# Patient Record
Sex: Female | Born: 2005 | Race: White | Hispanic: No | Marital: Single | Smoking: Never smoker
Health system: Southern US, Community
[De-identification: ages and names within clinical notes are randomized; demographics above are authoritative.]

## PROBLEM LIST (undated history)

## (undated) DIAGNOSIS — Z789 Other specified health status: Secondary | ICD-10-CM

## (undated) HISTORY — PX: OTHER SURGICAL HISTORY: SHX169

## (undated) HISTORY — DX: Other specified health status: Z78.9

---
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
## 2023-03-05 DIAGNOSIS — Z111 Encounter for screening for respiratory tuberculosis: Secondary | ICD-10-CM | POA: Diagnosis not present

## 2023-03-31 DIAGNOSIS — J208 Acute bronchitis due to other specified organisms: Secondary | ICD-10-CM | POA: Diagnosis not present

## 2023-03-31 DIAGNOSIS — Z111 Encounter for screening for respiratory tuberculosis: Secondary | ICD-10-CM | POA: Diagnosis not present

## 2023-03-31 DIAGNOSIS — J029 Acute pharyngitis, unspecified: Secondary | ICD-10-CM | POA: Diagnosis not present

## 2023-03-31 DIAGNOSIS — Z03818 Encounter for observation for suspected exposure to other biological agents ruled out: Secondary | ICD-10-CM | POA: Diagnosis not present

## 2023-04-02 DIAGNOSIS — Z111 Encounter for screening for respiratory tuberculosis: Secondary | ICD-10-CM | POA: Diagnosis not present

## 2023-04-04 DIAGNOSIS — Z419 Encounter for procedure for purposes other than remedying health state, unspecified: Secondary | ICD-10-CM | POA: Diagnosis not present

## 2023-05-04 DIAGNOSIS — Z419 Encounter for procedure for purposes other than remedying health state, unspecified: Secondary | ICD-10-CM | POA: Diagnosis not present

## 2023-06-04 DIAGNOSIS — Z419 Encounter for procedure for purposes other than remedying health state, unspecified: Secondary | ICD-10-CM | POA: Diagnosis not present

## 2023-07-05 DIAGNOSIS — Z419 Encounter for procedure for purposes other than remedying health state, unspecified: Secondary | ICD-10-CM | POA: Diagnosis not present

## 2023-08-04 DIAGNOSIS — Z419 Encounter for procedure for purposes other than remedying health state, unspecified: Secondary | ICD-10-CM | POA: Diagnosis not present

## 2023-09-04 DIAGNOSIS — Z419 Encounter for procedure for purposes other than remedying health state, unspecified: Secondary | ICD-10-CM | POA: Diagnosis not present

## 2023-10-04 DIAGNOSIS — Z419 Encounter for procedure for purposes other than remedying health state, unspecified: Secondary | ICD-10-CM | POA: Diagnosis not present

## 2023-11-04 DIAGNOSIS — Z419 Encounter for procedure for purposes other than remedying health state, unspecified: Secondary | ICD-10-CM | POA: Diagnosis not present

## 2023-12-05 DIAGNOSIS — Z419 Encounter for procedure for purposes other than remedying health state, unspecified: Secondary | ICD-10-CM | POA: Diagnosis not present

## 2024-01-02 DIAGNOSIS — Z419 Encounter for procedure for purposes other than remedying health state, unspecified: Secondary | ICD-10-CM | POA: Diagnosis not present

## 2024-01-22 DIAGNOSIS — J029 Acute pharyngitis, unspecified: Secondary | ICD-10-CM | POA: Diagnosis not present

## 2024-02-10 DIAGNOSIS — Z23 Encounter for immunization: Secondary | ICD-10-CM | POA: Diagnosis not present

## 2024-02-12 DIAGNOSIS — Z111 Encounter for screening for respiratory tuberculosis: Secondary | ICD-10-CM | POA: Diagnosis not present

## 2024-02-13 DIAGNOSIS — Z419 Encounter for procedure for purposes other than remedying health state, unspecified: Secondary | ICD-10-CM | POA: Diagnosis not present

## 2024-02-26 DIAGNOSIS — Z111 Encounter for screening for respiratory tuberculosis: Secondary | ICD-10-CM | POA: Diagnosis not present

## 2024-02-29 DIAGNOSIS — Z111 Encounter for screening for respiratory tuberculosis: Secondary | ICD-10-CM | POA: Diagnosis not present

## 2024-03-14 DIAGNOSIS — Z419 Encounter for procedure for purposes other than remedying health state, unspecified: Secondary | ICD-10-CM | POA: Diagnosis not present

## 2024-04-14 DIAGNOSIS — Z419 Encounter for procedure for purposes other than remedying health state, unspecified: Secondary | ICD-10-CM | POA: Diagnosis not present

## 2024-05-14 DIAGNOSIS — Z419 Encounter for procedure for purposes other than remedying health state, unspecified: Secondary | ICD-10-CM | POA: Diagnosis not present

## 2024-06-14 DIAGNOSIS — Z419 Encounter for procedure for purposes other than remedying health state, unspecified: Secondary | ICD-10-CM | POA: Diagnosis not present

## 2024-07-15 DIAGNOSIS — Z419 Encounter for procedure for purposes other than remedying health state, unspecified: Secondary | ICD-10-CM | POA: Diagnosis not present
